# Patient Record
Sex: Male | Born: 1995 | Race: White | Hispanic: No | Smoking: Never smoker
Health system: Southern US, Community
[De-identification: ages and names within clinical notes are randomized; demographics above are authoritative.]

## PROBLEM LIST (undated history)

## (undated) DIAGNOSIS — J189 Pneumonia, unspecified organism: Secondary | ICD-10-CM

## (undated) HISTORY — PX: TONSILLECTOMY: SUR1361

---
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
## 2017-12-08 ENCOUNTER — Ambulatory Visit: Payer: Medicaid Other

## 2017-12-08 ENCOUNTER — Ambulatory Visit
Admission: EM | Admit: 2017-12-08 | Discharge: 2017-12-08 | Disposition: A | Discharge status: Home / Self Care | Payer: Medicaid Other | Attending: Family Medicine | Admitting: Family Medicine

## 2017-12-08 ENCOUNTER — Encounter: Payer: Self-pay | Admitting: Emergency Medicine

## 2017-12-08 DIAGNOSIS — R0602 Shortness of breath: Secondary | ICD-10-CM | POA: Insufficient documentation

## 2017-12-08 DIAGNOSIS — R062 Wheezing: Secondary | ICD-10-CM

## 2017-12-08 DIAGNOSIS — R05 Cough: Secondary | ICD-10-CM

## 2017-12-08 DIAGNOSIS — J181 Lobar pneumonia, unspecified organism: Secondary | ICD-10-CM

## 2017-12-08 DIAGNOSIS — J189 Pneumonia, unspecified organism: Secondary | ICD-10-CM | POA: Diagnosis not present

## 2017-12-08 DIAGNOSIS — R0989 Other specified symptoms and signs involving the circulatory and respiratory systems: Secondary | ICD-10-CM | POA: Diagnosis present

## 2017-12-08 DIAGNOSIS — Z79899 Other long term (current) drug therapy: Secondary | ICD-10-CM | POA: Diagnosis not present

## 2017-12-08 MED ORDER — GUAIFENESIN-CODEINE 100-10 MG/5ML PO SYRP: 5.0000 mL | ORAL_SOLUTION | Freq: Three times a day (TID) | ORAL | 0 refills | Status: DC | PRN

## 2017-12-08 MED ORDER — FLUTICASONE PROPIONATE 50 MCG/ACT NA SUSP
2.0000 | Freq: Every day | NASAL | 0 refills | Status: DC
Start: 2017-12-08 — End: 2019-07-08

## 2017-12-08 MED ORDER — AZITHROMYCIN 250 MG PO TABS: 250.0000 mg | ORAL_TABLET | Freq: Every day | ORAL | 0 refills | Status: DC

## 2017-12-08 MED ORDER — BENZONATATE 200 MG PO CAPS: ORAL_CAPSULE | ORAL | 0 refills | Status: DC

## 2017-12-08 MED ORDER — ALBUTEROL SULFATE HFA 108 (90 BASE) MCG/ACT IN AERS: 1.0000 | INHALATION_SPRAY | Freq: Four times a day (QID) | RESPIRATORY_TRACT | 0 refills | Status: DC | PRN

## 2017-12-08 MED ORDER — IPRATROPIUM-ALBUTEROL 0.5-2.5 (3) MG/3ML IN SOLN
3.0000 mL | Freq: Once | RESPIRATORY_TRACT | Status: AC
  Administered 2017-12-08: 3 mL via RESPIRATORY_TRACT

## 2017-12-08 NOTE — ED Triage Notes (Signed)
Pt reports cough, congestion, SOB that started about 2 weeks ago also reports left ear pain that started a couple days ago

## 2017-12-08 NOTE — Discharge Instructions (Signed)
Cough and deep breathe frequently.  Use albuterol inhaler for shortness of breath or wheezing.  Ibuprofen or Tylenol for fever or pain.  Go to emergency room if you worsen.  Recommend following up with your primary care physician in 4 to 6 weeks for possible follow-up x-ray

## 2017-12-08 NOTE — ED Provider Notes (Signed)
[facility]    CSN: [id number] Arrival date & time: 12/08/17  1541     History   Chief Complaint Chief Complaint  Patient presents with  . Cough    HPI Anthony Giles is a 22 y.o. male.   HPI  22 year old male presents with 2-week history of cough congestion shortness of breath chills starting about 2 weeks ago.  He also noticed a couple days ago pain in his left ear.  Wife, who just delivered a baby by C-section 8 days ago, has just been treated for a sinus infection. He has never  smoked before.  HIs cough is productive of thick yellow sputum.          History reviewed. No pertinent past medical history.  There are no active problems to display for this patient.   History reviewed. No pertinent surgical history.     Home Medications    Prior to Admission medications   Medication Sig Start Date End Date Taking? Authorizing Provider  Ascorbic Acid (VITAMIN C) 100 MG tablet Take 100 mg by mouth daily.   Yes [provider]  diphenhydrAMINE HCl (ALLERGY MED PO) Take by mouth.   Yes [provider]  albuterol (PROVENTIL HFA;VENTOLIN HFA) 108 (90 Base) MCG/ACT inhaler Inhale 1-2 puffs into the lungs every 6 (six) hours as needed for wheezing or shortness of breath. Use with spacer 12/08/17   Lutricia Feil, PA-C  azithromycin (ZITHROMAX) 250 MG tablet Take 1 tablet (250 mg total) by mouth daily. Take first 2 tablets together, then 1 every day until finished. 12/08/17   Lutricia Feil, PA-C  benzonatate (TESSALON) 200 MG capsule Take one cap TID PRN cough 12/08/17   Ovid Curd P, PA-C  fluticasone [facility]) 50 MCG/ACT nasal spray Place 2 sprays into both nostrils daily. 12/08/17   Lutricia Feil, PA-C  guaiFENesin-codeine (CHERATUSSIN AC) 100-10 MG/5ML syrup Take 5 mLs by mouth 3 (three) times daily as needed for cough. 12/08/17   Lutricia Feil, PA-C    Family History History reviewed. No pertinent family history.  Social  History Social History   Tobacco Use  . Smoking status: Never Smoker  . Smokeless tobacco: Never Used  Substance Use Topics  . Alcohol use: Never    Frequency: Never  . Drug use: Not on file     Allergies   Patient has no known allergies.   Review of Systems Review of Systems  Constitutional: Positive for activity change, chills and fatigue. Negative for appetite change and fever.  HENT: Positive for congestion, ear pain, postnasal drip, sinus pressure and sinus pain. Negative for ear discharge.   Respiratory: Positive for cough, shortness of breath and wheezing.   All other systems reviewed and are negative.    Physical Exam Triage Vital Signs ED Triage Vitals  Enc Vitals Group     BP 12/08/17 1555 (!) 133/96     Pulse Rate 12/08/17 1555 (!) 101     Resp 12/08/17 1555 16     Temp 12/08/17 1555 97.6 F (36.4 C)     Temp Source 12/08/17 1555 Oral     SpO2 12/08/17 1555 97 %     Weight --      Height 12/08/17 1556 6\' 3"  (1.905 m)     Head Circumference --      Peak Flow --      Pain Score 12/08/17 1555 5     Pain Loc --      Pain  Edu? --      Excl. in GC? --    No data found.  Updated Vital Signs BP 128/88 (BP Location: Left Arm)   Pulse (!) 101   Temp 97.6 F (36.4 C) (Oral)   Resp 16   Ht 6\' 3"  (1.905 m)   SpO2 97%   Visual Acuity Right Eye Distance:   Left Eye Distance:   Bilateral Distance:    Right Eye Near:   Left Eye Near:    Bilateral Near:     Physical Exam  Constitutional: He is oriented to person, place, and time. He appears well-developed and well-nourished. No distress.  HENT:  Head: Normocephalic.  Right Ear: External ear normal.  Left Ear: External ear normal.  Nose: Nose normal.  Mouth/Throat: Oropharynx is clear and moist. No oropharyngeal exudate.  Eyes: Pupils are equal, round, and reactive to light. Right eye exhibits no discharge. Left eye exhibits no discharge.  Neck: Normal range of motion.  Pulmonary/Chest: Effort  normal. He has wheezes. He has rales.  Musculoskeletal: Normal range of motion.  Lymphadenopathy:    He has no cervical adenopathy.  Neurological: He is alert and oriented to person, place, and time.  Skin: Skin is warm and dry. He is not diaphoretic.  Psychiatric: He has a normal mood and affect. His behavior is normal. Judgment and thought content normal.  Nursing note and vitals reviewed.    UC Treatments / Results  Labs (all labs ordered are listed, but only abnormal results are displayed) Labs Reviewed - No data to display  EKG None  Radiology Dg Chest 2 View  Result Date: 12/08/2017 CLINICAL DATA:  Cough, shortness of breath. EXAM: CHEST - 2 VIEW COMPARISON:  Radiographs of June 30, 2013. FINDINGS: The heart size and mediastinal contours are within normal limits. No pneumothorax or pleural effusion is noted. Left lung is clear. Mild right upper lobe opacity is noted which may represent pneumonia. The visualized skeletal structures are unremarkable. IMPRESSION: Possible mild right upper lobe opacity concerning for pneumonia. Electronically Signed   By: Lupita Raider, M.D.   On: 12/08/2017 16:39    Procedures Procedures (including critical care time)  Medications Ordered in UC Medications  ipratropium-albuterol (DUONEB) 0.5-2.5 (3) MG/3ML nebulizer solution 3 mL (3 mLs Nebulization Given 12/08/17 1624)    Initial Impression / Assessment and Plan / UC Course  I have reviewed the triage vital signs and the nursing notes.  Pertinent labs & imaging results that were available during my care of the patient were reviewed by me and considered in my medical decision making (see chart for details).     Plan: 1. Test/x-ray results and diagnosis reviewed with patient 2. rx as per orders; risks, benefits, potential side effects reviewed with patient 3. Recommend supportive treatment with deep breathe frequently.  Use albuterol inhaler for shortness of breath.  Use cough  suppressants as needed.  Tylenol or Motrin for fever or pain.  If you worsen go to the emergency room.  Otherwise follow-up with primary care in 4 to 6 weeks. 4. F/u prn if symptoms worsen or don't improve  Final Clinical Impressions(s) / UC Diagnoses   Final diagnoses:  Community acquired pneumonia of right upper lobe of lung (HCC)     Discharge Instructions     Cough and deep breathe frequently.  Use albuterol inhaler for shortness of breath or wheezing.  Ibuprofen or Tylenol for fever or pain.  Go to emergency room if you worsen.  Recommend  following up with your primary care physician in 4 to 6 weeks for possible follow-up x-ray    ED Prescriptions    Medication Sig Dispense Auth. Provider   azithromycin (ZITHROMAX) 250 MG tablet Take 1 tablet (250 mg total) by mouth daily. Take first 2 tablets together, then 1 every day until finished. 6 tablet Ovid Curdoemer, William P, PA-C   albuterol (PROVENTIL HFA;VENTOLIN HFA) 108 (90 Base) MCG/ACT inhaler Inhale 1-2 puffs into the lungs every 6 (six) hours as needed for wheezing or shortness of breath. Use with spacer 1 Inhaler Lutricia Feiloemer, William P, PA-C   benzonatate (TESSALON) 200 MG capsule Take one cap TID PRN cough 30 capsule Ovid Curdoemer, William P, PA-C   guaiFENesin-codeine (CHERATUSSIN AC) 100-10 MG/5ML syrup Take 5 mLs by mouth 3 (three) times daily as needed for cough. 120 mL Ovid Curdoemer, William P, PA-C   fluticasone (FLONASE) 50 MCG/ACT nasal spray Place 2 sprays into both nostrils daily. 16 g Lutricia Feiloemer, William P, PA-C     Controlled Substance Prescriptions [place] Controlled Substance Registry consulted? Not Applicable   Lutricia FeilRoemer, William P, PA-C 12/08/17 1707

## 2018-03-13 ENCOUNTER — Encounter: Payer: Self-pay | Admitting: Emergency Medicine

## 2018-03-13 ENCOUNTER — Other Ambulatory Visit: Payer: Self-pay

## 2018-03-13 ENCOUNTER — Ambulatory Visit
Admission: EM | Admit: 2018-03-13 | Discharge: 2018-03-13 | Disposition: A | Discharge status: Home / Self Care | Payer: Medicaid Other | Attending: Family Medicine | Admitting: Family Medicine

## 2018-03-13 DIAGNOSIS — B9789 Other viral agents as the cause of diseases classified elsewhere: Secondary | ICD-10-CM | POA: Diagnosis not present

## 2018-03-13 DIAGNOSIS — Z79899 Other long term (current) drug therapy: Secondary | ICD-10-CM | POA: Diagnosis not present

## 2018-03-13 DIAGNOSIS — J069 Acute upper respiratory infection, unspecified: Secondary | ICD-10-CM

## 2018-03-13 DIAGNOSIS — J029 Acute pharyngitis, unspecified: Secondary | ICD-10-CM | POA: Diagnosis present

## 2018-03-13 DIAGNOSIS — J028 Acute pharyngitis due to other specified organisms: Secondary | ICD-10-CM | POA: Diagnosis not present

## 2018-03-13 HISTORY — DX: Pneumonia, unspecified organism: J18.9

## 2018-03-13 LAB — RAPID STREP SCREEN (MED CTR MEBANE ONLY): Streptococcus, Group A Screen (Direct): NEGATIVE

## 2018-03-13 MED ORDER — LIDOCAINE VISCOUS HCL 2 % MT SOLN: OROMUCOSAL | 0 refills | Status: DC

## 2018-03-13 NOTE — ED Provider Notes (Signed)
[facility]    CSN: [id number] Arrival date & time: 03/13/18  1339     History   Chief Complaint Chief Complaint  Patient presents with  . Sore Throat    HPI Anthony Giles is a 22 y.o. male.   The history is provided by the patient.  Sore Throat  This is a new problem. The current episode started 2 days ago. The problem occurs constantly. The problem has not changed since onset.Pertinent negatives include no chest pain, no abdominal pain, no headaches and no shortness of breath. He has tried nothing for the symptoms.    Past Medical History:  Diagnosis Date  . Pneumonia     There are no active problems to display for this patient.   History reviewed. No pertinent surgical history.     Home Medications    Prior to Admission medications   Medication Sig Start Date End Date Taking? Authorizing Provider  Ascorbic Acid (VITAMIN C) 100 MG tablet Take 100 mg by mouth daily.   Yes [provider]  albuterol (PROVENTIL HFA;VENTOLIN HFA) 108 (90 Base) MCG/ACT inhaler Inhale 1-2 puffs into the lungs every 6 (six) hours as needed for wheezing or shortness of breath. Use with spacer 12/08/17   Lutricia Feil, PA-C  azithromycin (ZITHROMAX) 250 MG tablet Take 1 tablet (250 mg total) by mouth daily. Take first 2 tablets together, then 1 every day until finished. 12/08/17   Lutricia Feil, PA-C  benzonatate (TESSALON) 200 MG capsule Take one cap TID PRN cough 12/08/17   Ovid Curd P, PA-C  diphenhydrAMINE HCl (ALLERGY MED PO) Take by mouth.    [provider]  fluticasone (FLONASE) 50 MCG/ACT nasal spray Place 2 sprays into both nostrils daily. 12/08/17   Lutricia Feil, PA-C  guaiFENesin-codeine (CHERATUSSIN AC) 100-10 MG/5ML syrup Take 5 mLs by mouth 3 (three) times daily as needed for cough. 12/08/17   Lutricia Feil, PA-C  lidocaine (XYLOCAINE) 2 % solution 20 ml gargle and spit q 6 hours prn 03/13/18   Payton Mccallum, MD    Family  History Family History  Problem Relation Age of Onset  . Healthy Mother   . CAD Father     Social History Social History   Tobacco Use  . Smoking status: Never Smoker  . Smokeless tobacco: Never Used  Substance Use Topics  . Alcohol use: Never    Frequency: Never  . Drug use: Never     Allergies   Patient has no known allergies.   Review of Systems Review of Systems  Respiratory: Negative for shortness of breath.   Cardiovascular: Negative for chest pain.  Gastrointestinal: Negative for abdominal pain.  Neurological: Negative for headaches.     Physical Exam Triage Vital Signs ED Triage Vitals  Enc Vitals Group     BP 03/13/18 1408 131/79     Pulse Rate 03/13/18 1408 91     Resp 03/13/18 1408 18     Temp 03/13/18 1408 97.8 F (36.6 C)     Temp Source 03/13/18 1408 Oral     SpO2 03/13/18 1408 100 %     Weight 03/13/18 1405 (!) 368 lb (166.9 kg)     Height 03/13/18 1405 6\' 3"  (1.905 m)     Head Circumference --      Peak Flow --      Pain Score 03/13/18 1405 6     Pain Loc --      Pain Edu? --  Excl. in GC? --    No data found.  Updated Vital Signs BP 131/79 (BP Location: Right Arm)   Pulse 91   Temp 97.8 F (36.6 C) (Oral)   Resp 18   Ht 6\' 3"  (1.905 m)   Wt (!) 166.9 kg   SpO2 100%   BMI 46.00 kg/m   Visual Acuity Right Eye Distance:   Left Eye Distance:   Bilateral Distance:    Right Eye Near:   Left Eye Near:    Bilateral Near:     Physical Exam  Constitutional: He appears well-developed and well-nourished. No distress.  HENT:  Head: Normocephalic and atraumatic.  Right Ear: Tympanic membrane, external ear and ear canal normal.  Left Ear: Tympanic membrane, external ear and ear canal normal.  Nose: Nose normal.  Mouth/Throat: Uvula is midline and mucous membranes are normal. Posterior oropharyngeal erythema present. No oropharyngeal exudate, posterior oropharyngeal edema or tonsillar abscesses. No tonsillar exudate.  Neck:  Normal range of motion. Neck supple. No tracheal deviation present. No thyromegaly present.  Cardiovascular: Normal rate, regular rhythm and normal heart sounds.  Pulmonary/Chest: Effort normal and breath sounds normal. No stridor. No respiratory distress. He has no wheezes. He has no rales. He exhibits no tenderness.  Lymphadenopathy:    He has no cervical adenopathy.  Neurological: He is alert.  Skin: Skin is warm and dry. No rash noted. He is not diaphoretic.  Nursing note and vitals reviewed.    UC Treatments / Results  Labs (all labs ordered are listed, but only abnormal results are displayed) Labs Reviewed  RAPID STREP SCREEN ([facility] ONLY)  CULTURE, GROUP A STREP [facility])    EKG None  Radiology No results found.  Procedures Procedures (including critical care time)  Medications Ordered in UC Medications - No data to display  Initial Impression / Assessment and Plan / UC Course  I have reviewed the triage vital signs and the nursing notes.  Pertinent labs & imaging results that were available during my care of the patient were reviewed by me and considered in my medical decision making (see chart for details).      Final Clinical Impressions(s) / UC Diagnoses   Final diagnoses:  Viral pharyngitis  Viral URI    ED Prescriptions    Medication Sig Dispense Auth. Provider   lidocaine (XYLOCAINE) 2 % solution 20 ml gargle and spit q 6 hours prn 100 mL Payton Mccallumonty, Velisa Regnier, MD      1. Lab result and diagnosis reviewed with patient 2. rx as per orders above; reviewed possible side effects, interactions, risks and benefits  3. Follow-up prn if symptoms worsen or don't improve   Controlled Substance Prescriptions [place] Controlled Substance Registry consulted? Not Applicable   Payton Mccallumonty, Oneita Allmon, MD 03/13/18 [phone]

## 2018-03-13 NOTE — ED Triage Notes (Signed)
Patient c/o sore throat and nasal drainage and congestion that started 2 days ago.

## 2018-03-16 LAB — CULTURE, GROUP A STREP (THRC)

## 2018-07-31 ENCOUNTER — Other Ambulatory Visit: Payer: Self-pay

## 2018-07-31 ENCOUNTER — Ambulatory Visit: Payer: Medicaid Other

## 2018-07-31 ENCOUNTER — Ambulatory Visit
Admission: EM | Admit: 2018-07-31 | Discharge: 2018-07-31 | Disposition: A | Discharge status: Home / Self Care | Payer: Medicaid Other | Attending: Family Medicine | Admitting: Family Medicine

## 2018-07-31 DIAGNOSIS — J029 Acute pharyngitis, unspecified: Secondary | ICD-10-CM | POA: Diagnosis not present

## 2018-07-31 DIAGNOSIS — J069 Acute upper respiratory infection, unspecified: Secondary | ICD-10-CM

## 2018-07-31 DIAGNOSIS — Z7189 Other specified counseling: Secondary | ICD-10-CM | POA: Diagnosis present

## 2018-07-31 LAB — RAPID STREP SCREEN (MED CTR MEBANE ONLY): Streptococcus, Group A Screen (Direct): NEGATIVE

## 2018-07-31 MED ORDER — LIDOCAINE VISCOUS HCL 2 % MT SOLN: 10.0000 mL | Freq: Four times a day (QID) | OROMUCOSAL | 0 refills | Status: DC | PRN

## 2018-07-31 NOTE — Discharge Instructions (Addendum)
Take medication as prescribed. Rest. Drink plenty of fluids. Refer to [facility] information and adhere. Stay home. Over the counter medication as discussed.   Follow up with your primary care physician this week as needed. Return to Urgent care for new or worsening concerns.

## 2018-07-31 NOTE — ED Provider Notes (Signed)
[facility] ____________________________________________  Time seen: Approximately 9:41 AM  I have reviewed the triage vital signs and the nursing notes.   HISTORY  Chief Complaint Sore Throat  HPI Anthony Giles is a 23 y.o. male presenting for evaluation of 1 week of cough, nasal congestion and sore throat.  States the cough has been very mild and only occasional.  States has continued with clear runny nose and sore throat.  States sore throat bothers him the most, and is painful to swallow currently moderate.  States has continued to eat and drink well overall though.  Also states he has had some intermittent gooping from his right eye only at night.  Denies vision change or eye pain.  Denies fevers.  Denies accompanying chest pain, shortness of breath.  States his wife and his mother were recently sick with cough complaints and they were evaluated and tested negative for COVID-19.  Denies other known sick contacts.  Has continued remain active.  No recent travel.  Has been using over-the-counter Alka-Seltzer cold with some improvement.  Denies difficulty sleeping.  Denies other aggravating alleviating factors.  No recent sickness.    Past Medical History:  Diagnosis Date  . Pneumonia     There are no active problems to display for this patient.   Past Surgical History:  Procedure Laterality Date  . TONSILLECTOMY       No current facility-administered medications for this encounter.   Current Outpatient Medications:  .  Ascorbic Acid (VITAMIN C) 100 MG tablet, Take 100 mg by mouth daily., Disp: , Rfl:  .  fluticasone (FLONASE) 50 MCG/ACT nasal spray, Place 2 sprays into both nostrils daily., Disp: 16 g, Rfl: 0 .  lidocaine (XYLOCAINE) 2 % solution, Use as directed 10 mLs in the mouth or throat every 6 (six) hours as needed (sore throat. gargle and spit as needed for sore throat.)., Disp: 100 mL, Rfl: 0  Allergies Patient has no known allergies.  Family History   Problem Relation Age of Onset  . Healthy Mother   . CAD Father     Social History Social History   Tobacco Use  . Smoking status: Never Smoker  . Smokeless tobacco: Never Used  Substance Use Topics  . Alcohol use: Never    Frequency: Never  . Drug use: Never    Review of Systems Constitutional: No fever/chills Eyes: No visual changes. As above.  ENT: As above.  Cardiovascular: Denies chest pain. Respiratory: Denies shortness of breath. Gastrointestinal: No abdominal pain.   Musculoskeletal: Negative for back pain. Skin: Negative for rash.   ____________________________________________   PHYSICAL EXAM:  VITAL SIGNS: ED Triage Vitals  Enc Vitals Group     BP 07/31/18 0824 (!) 132/93     Pulse Rate 07/31/18 0824 98     Resp 07/31/18 0824 16     Temp 07/31/18 0824 98.4 F (36.9 C)     Temp Source 07/31/18 0824 Oral     SpO2 07/31/18 0824 100 %     Weight 07/31/18 0819 (!) 382 lb (173.3 kg)     Height 07/31/18 0819 6\' 3"  (1.905 m)     Head Circumference --      Peak Flow --      Pain Score 07/31/18 0818 8     Pain Loc --      Pain Edu? --      Excl. in GC? --     Constitutional: Alert and oriented. Well appearing and in no acute  distress. Eyes: Conjunctivae are normal.  No active drainage bilaterally.  No foreign body noted bilaterally.  PERRL. EOMI. Head: Atraumatic. No sinus tenderness to palpation. No swelling. No erythema.  Ears: no erythema, normal TMs bilaterally.   Nose:Nasal congestion with clear rhinorrhea  Mouth/Throat: Mucous membranes are moist. Mild pharyngeal erythema.  Tonsils surgically absent.  No exudate.  No uvular shift or deviation. Neck: No stridor.  No cervical spine tenderness to palpation. Hematological/Lymphatic/Immunilogical: No cervical lymphadenopathy. Cardiovascular: Normal rate, regular rhythm. Grossly normal heart sounds.  Good peripheral circulation. Respiratory: Normal respiratory effort.  No retractions. No wheezes, rales or  rhonchi. Good air movement.  Musculoskeletal: Ambulatory with steady gait.  Neurologic:  Normal speech and language. No gait instability. Skin:  Skin appears warm, dry and intact. No rash noted. Psychiatric: Mood and affect are normal. Speech and behavior are normal.  ___________________________________________   LABS (all labs ordered are listed, but only abnormal results are displayed)  Labs Reviewed  RAPID STREP SCREEN ([facility] ONLY)  CULTURE, GROUP A STREP [facility])    RADIOLOGY  Dg Chest 2 View  Result Date: 07/31/2018 CLINICAL DATA:  Cough for 1 week EXAM: CHEST - 2 VIEW COMPARISON:  12/08/2017 chest radiograph. FINDINGS: Stable cardiomediastinal silhouette with normal heart size. No pneumothorax. No pleural effusion. Lungs appear clear, with no acute consolidative airspace disease and no pulmonary edema. IMPRESSION: No active cardiopulmonary disease. Electronically Signed   By: Delbert Phenix M.D.   On: 07/31/2018 08:54   ____________________________________________   PROCEDURES Procedures    INITIAL IMPRESSION / ASSESSMENT AND PLAN / ED COURSE  Pertinent labs & imaging results that were available during my care of the patient were reviewed by me and considered in my medical decision making (see chart for details).  Well-appearing patient.  No acute distress.  Suspect viral illness.  Strep negative will culture.  Chest x-ray as above, no active cardiopulmonary disease.  Will treat with PRN viscous lidocaine.  Continue over-the-counter supportive care.  Rest, fluids and monitor.  [place] DHHS COVID-19 information also given directed to adhere and remain home.Discussed indication, risks and benefits of medications with patient.  Discussed follow up with Primary care physician this week. Discussed follow up and return parameters including no resolution or any worsening concerns. Patient verbalized understanding and agreed to plan.   ____________________________________________    FINAL CLINICAL IMPRESSION(S) / ED DIAGNOSES  Final diagnoses:  Pharyngitis, unspecified etiology  Upper respiratory tract infection, unspecified type  Advice Given About Covid-19 Virus Infection     ED Discharge Orders         Ordered    lidocaine (XYLOCAINE) 2 % solution  Every 6 hours PRN     07/31/18 5797           Note: This dictation was prepared with Dragon dictation along with smaller phrase technology. Any transcriptional errors that result from this process are unintentional.         Renford Dills, NP 07/31/18 [phone]

## 2018-07-31 NOTE — ED Triage Notes (Signed)
Patient complains of sore throat and cough that started 1 week ago, denies any fever or shortness of breath.

## 2018-08-03 LAB — CULTURE, GROUP A STREP (THRC)

## 2018-09-24 IMAGING — CR DG CHEST 2V
3 series · 3 of 3 positions shown · non-contrast
Comparison: Radiographs June 30, 2013.

CLINICAL DATA: Cough, shortness of breath.

EXAM:
CHEST - 2 VIEW

[chest pa]
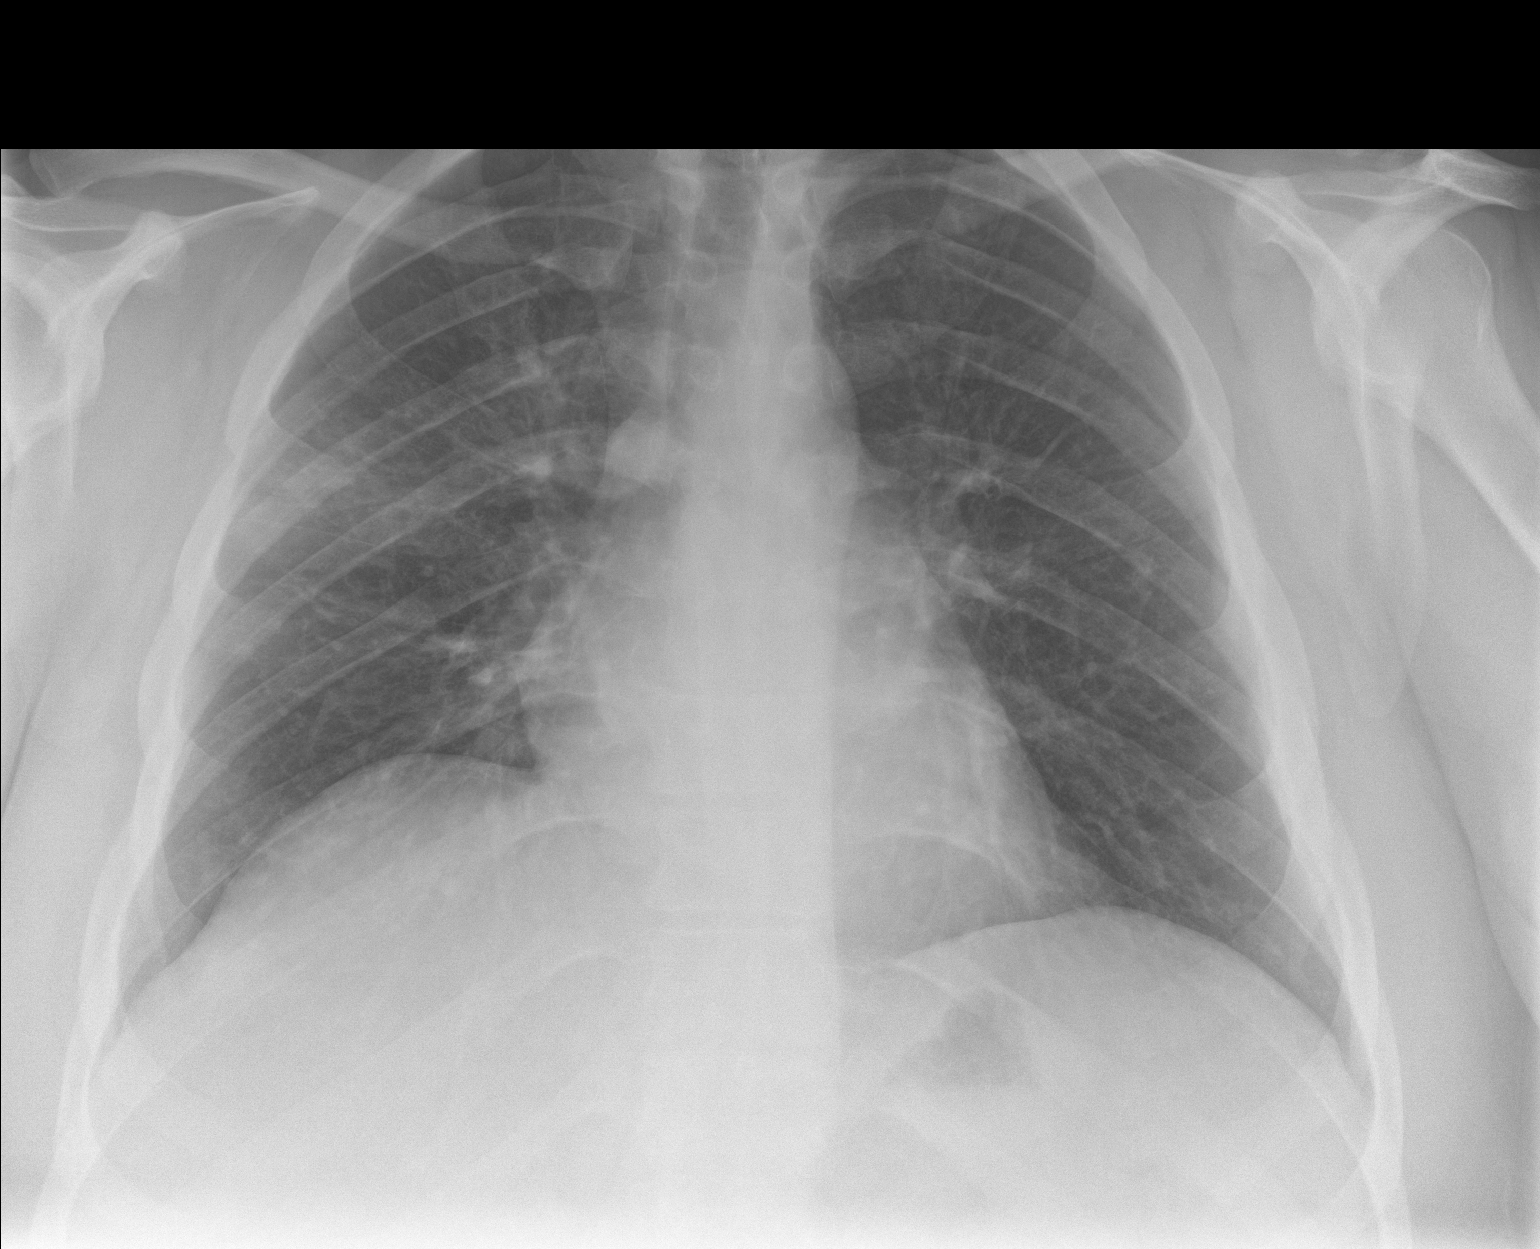

[chest lat (1 of 2)]
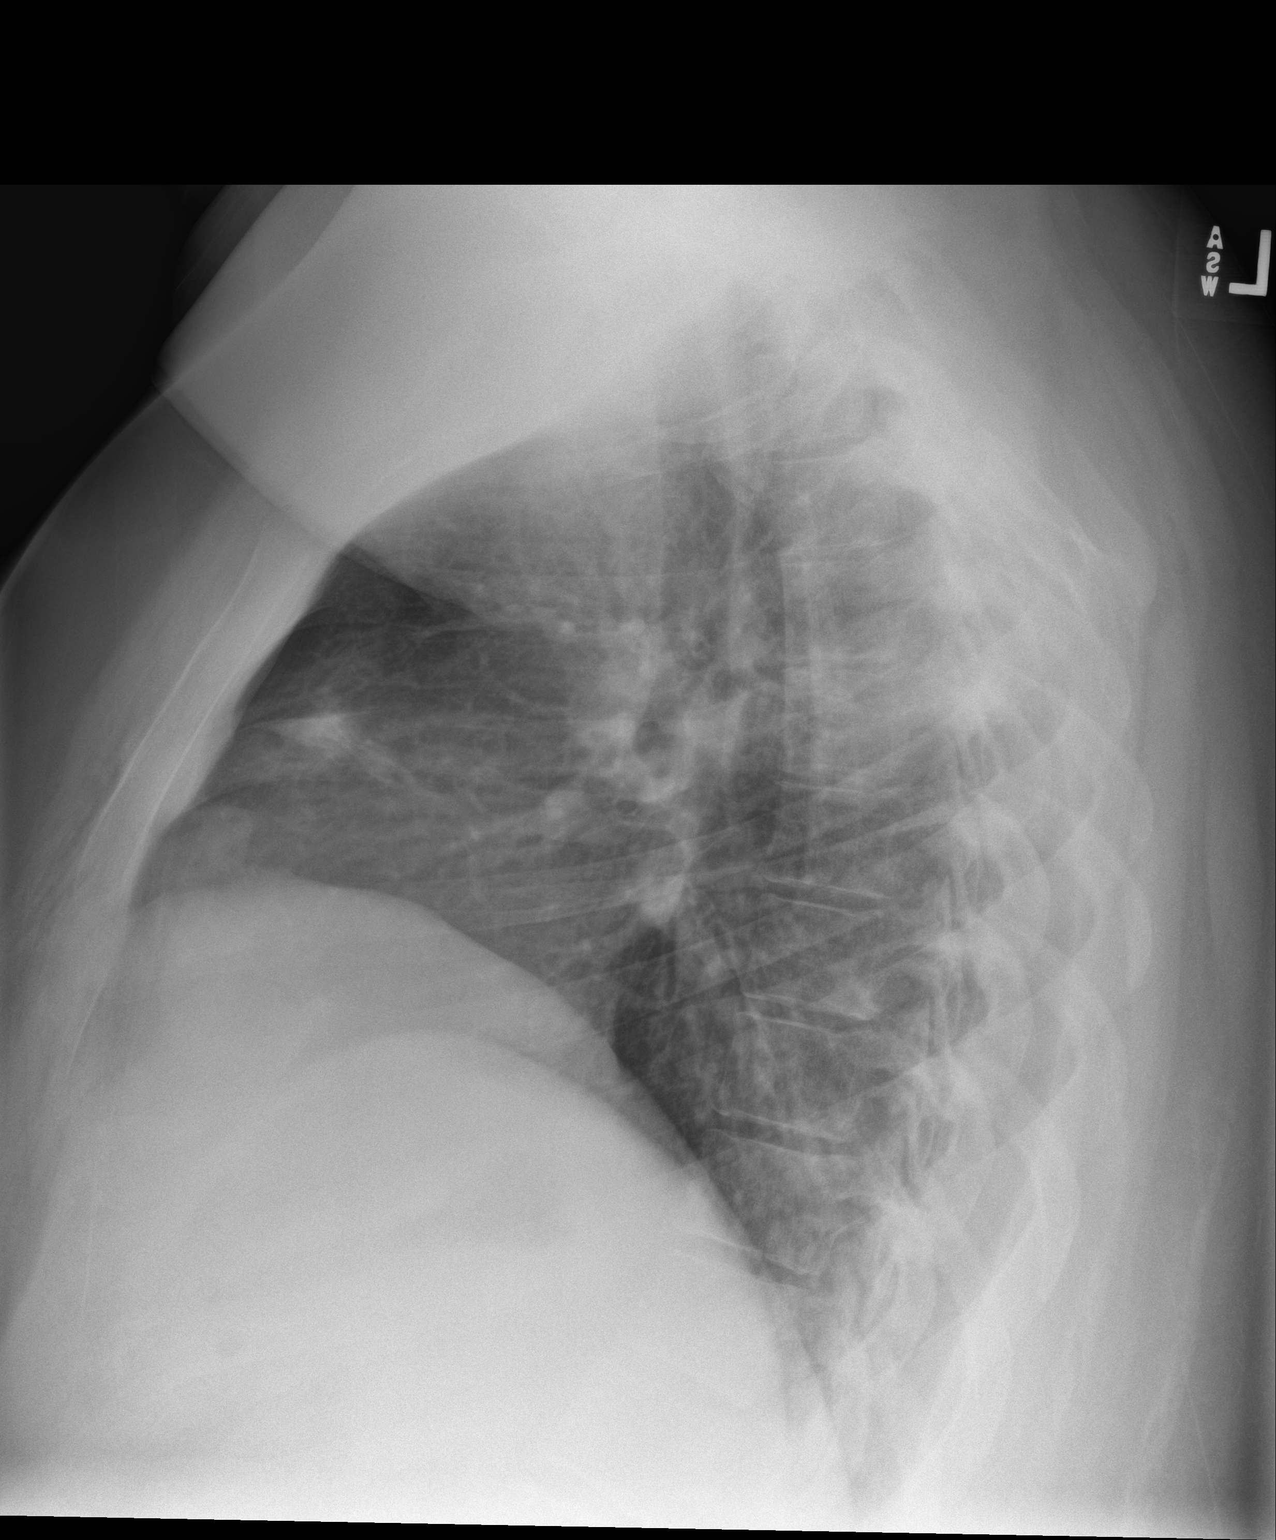

[chest lat (2 of 2)]
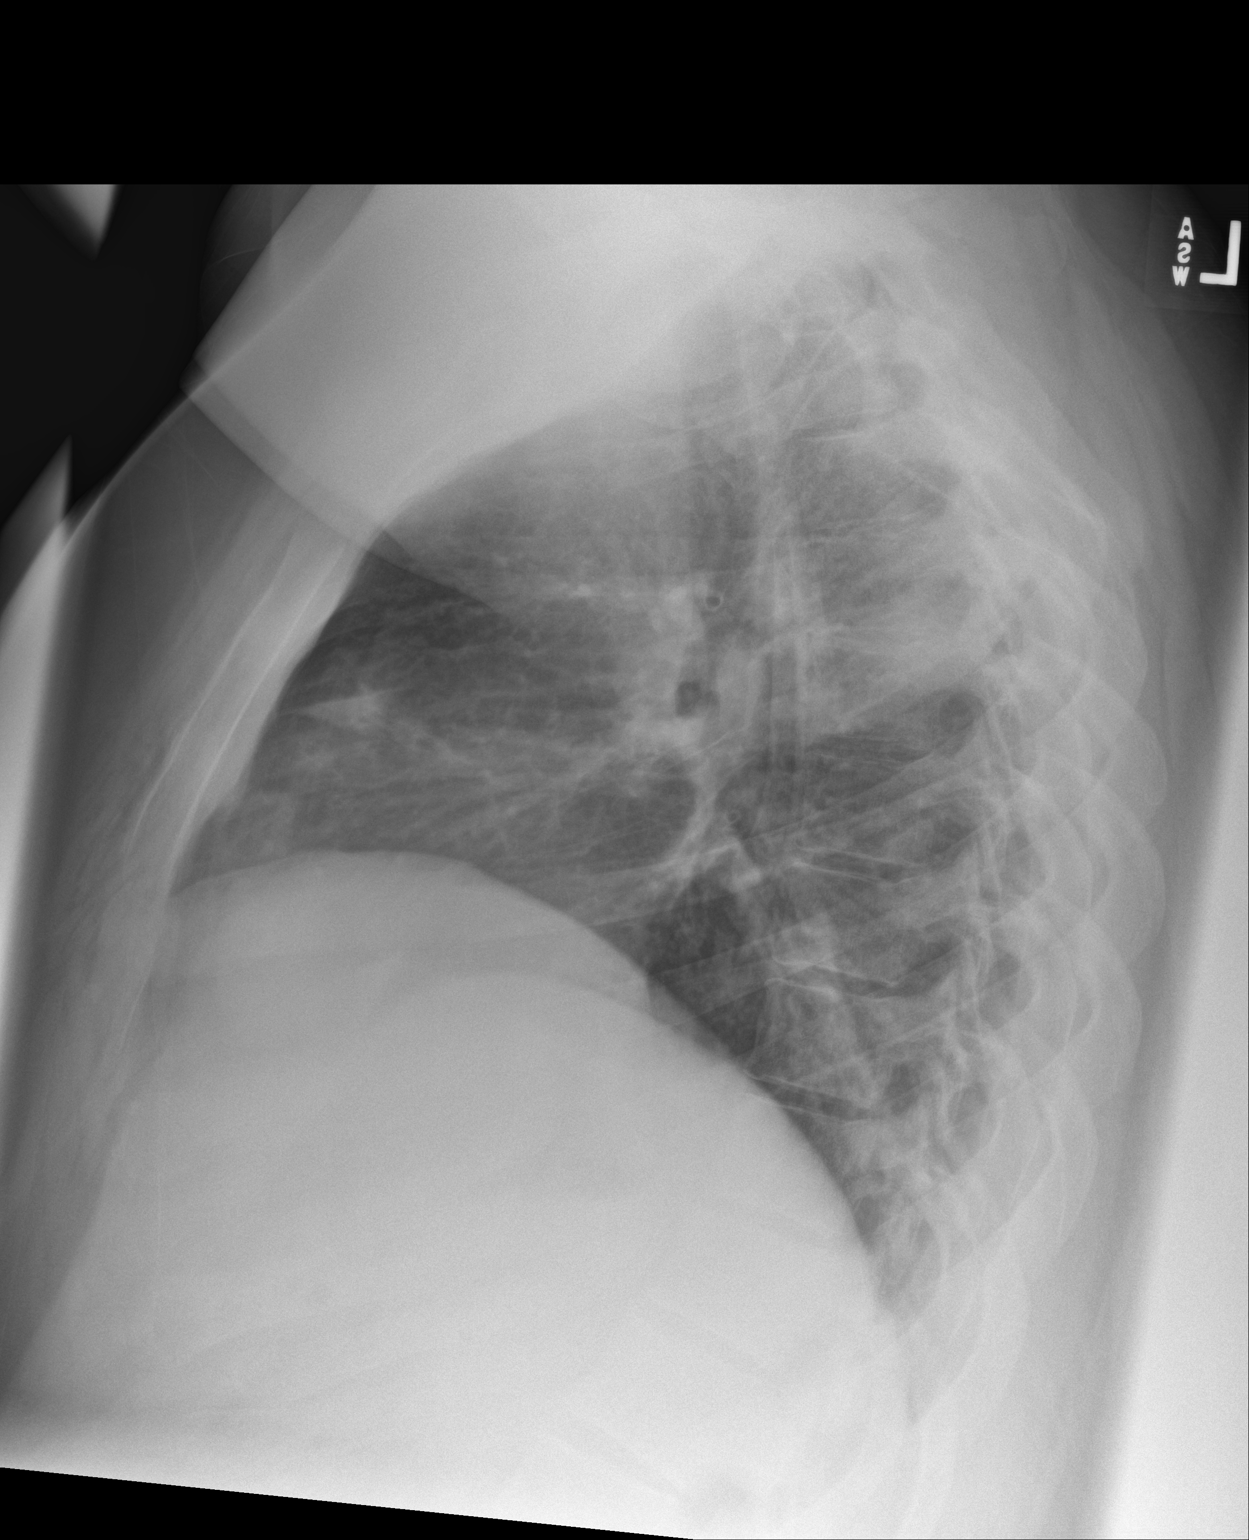

[3 of 3 positions shown; findings below may reference images not displayed]

FINDINGS: The heart size and mediastinal contours are within normal limits. No
pneumothorax or pleural effusion is noted. Left lung is clear. Mild
right upper lobe opacity is noted which may represent pneumonia. The
visualized skeletal structures are unremarkable.
IMPRESSION: Possible mild right upper lobe opacity concerning for pneumonia.

## 2019-07-08 ENCOUNTER — Ambulatory Visit: Payer: Medicaid Other

## 2019-07-08 ENCOUNTER — Ambulatory Visit
Admission: EM | Admit: 2019-07-08 | Discharge: 2019-07-08 | Disposition: A | Discharge status: Home / Self Care | Payer: Medicaid Other | Attending: Family Medicine | Admitting: Family Medicine

## 2019-07-08 ENCOUNTER — Other Ambulatory Visit: Payer: Self-pay

## 2019-07-08 DIAGNOSIS — S82832A Other fracture of upper and lower end of left fibula, initial encounter for closed fracture: Secondary | ICD-10-CM | POA: Insufficient documentation

## 2019-07-08 DIAGNOSIS — W010XXA Fall on same level from slipping, tripping and stumbling without subsequent striking against object, initial encounter: Secondary | ICD-10-CM

## 2019-07-08 MED ORDER — KETOROLAC TROMETHAMINE 10 MG PO TABS: 10.0000 mg | ORAL_TABLET | Freq: Four times a day (QID) | ORAL | 0 refills | Status: AC | PRN

## 2019-07-08 NOTE — ED Provider Notes (Signed)
[facility]    CSN: [id number] Arrival date & time: 07/08/19  1318      History   Chief Complaint Chief Complaint  Patient presents with  . Ankle Pain    left   HPI  24 year old male presents with the above complaint.  Patient reports that he was stepping off the porch onto a stepping stone and rolled his left ankle.  He reports significant swelling and pain particular around the lateral malleolus.  Pain 8/10 in severity.  Worse with palpation and activity.  No relieving factors.  No other associated symptoms.  No other complaints.  Past Medical History:  Diagnosis Date  . Pneumonia    Past Surgical History:  Procedure Laterality Date  . TONSILLECTOMY     Home Medications    Prior to Admission medications   Medication Sig Start Date End Date Taking? Authorizing Provider  Ascorbic Acid (VITAMIN C) 100 MG tablet Take 100 mg by mouth daily.   Yes [provider]  cetirizine (ZYRTEC) 10 MG tablet Take by mouth.   Yes [provider]  ketorolac (TORADOL) 10 MG tablet Take 1 tablet (10 mg total) by mouth every 6 (six) hours as needed for moderate pain or severe pain. 07/08/19   Tommie Sams, DO  fluticasone (FLONASE) 50 MCG/ACT nasal spray Place 2 sprays into both nostrils daily. 12/08/17 07/08/19  Lutricia Feil, PA-C    Family History Family History  Problem Relation Age of Onset  . Healthy Mother   . CAD Father     Social History Social History   Tobacco Use  . Smoking status: Never Smoker  . Smokeless tobacco: Never Used  Substance Use Topics  . Alcohol use: Never  . Drug use: Never     Allergies   Patient has no known allergies.   Review of Systems Review of Systems  Constitutional: Negative.   Musculoskeletal:       Left ankle pain and swelling.   Physical Exam Triage Vital Signs ED Triage Vitals  Enc Vitals Group     BP 07/08/19 1337 104/67     Pulse Rate 07/08/19 1337 71     Resp 07/08/19 1337 18     Temp  07/08/19 1337 98 F (36.7 C)     Temp Source 07/08/19 1337 Oral     SpO2 07/08/19 1337 100 %     Weight --      Height 07/08/19 1335 6\' 3"  (1.905 m)     Head Circumference --      Peak Flow --      Pain Score 07/08/19 1334 8     Pain Loc --      Pain Edu? --      Excl. in GC? --    No data found.  Updated Vital Signs BP 104/67 (BP Location: Right Arm)   Pulse 71   Temp 98 F (36.7 C) (Oral)   Resp 18   Ht 6\' 3"  (1.905 m)   SpO2 100%   BMI 47.75 kg/m   Visual Acuity Right Eye Distance:   Left Eye Distance:   Bilateral Distance:    Right Eye Near:   Left Eye Near:    Bilateral Near:     Physical Exam Vitals and nursing note reviewed.  Constitutional:      General: He is not in acute distress.    Appearance: He is obese.  HENT:     Head: Normocephalic and atraumatic.  Eyes:  General:        Right eye: No discharge.        Left eye: No discharge.     Conjunctiva/sclera: Conjunctivae normal.  Pulmonary:     Effort: Pulmonary effort is normal. No respiratory distress.  Musculoskeletal:     Comments: Left ankle -swelling noted over the lateral malleolus.  Exquisitely tender to palpation.  Neurological:     Mental Status: He is alert.  Psychiatric:        Mood and Affect: Mood normal.        Behavior: Behavior normal.    UC Treatments / Results  Labs (all labs ordered are listed, but only abnormal results are displayed) Labs Reviewed - No data to display  EKG   Radiology DG Ankle Complete Left  Result Date: 07/08/2019 CLINICAL DATA:  Left ankle injury. EXAM: LEFT ANKLE COMPLETE - 3+ VIEW COMPARISON:  None. FINDINGS: There is question subtle cortical discontinuity in the distal tip of the fibula raising the question of fracture. Soft tissue swelling is identified in the lateral left ankle. There is no dislocation. IMPRESSION: There is question subtle cortical discontinuity in the distal tip of the fibula raising the question of fracture. Soft tissue  swelling is identified in the lateral left ankle. Electronically Signed   By: Abelardo Diesel M.D.   On: 07/08/2019 14:28    Procedures Procedures (including critical care time)  Medications Ordered in UC Medications - No data to display  Initial Impression / Assessment and Plan / UC Course  I have reviewed the triage vital signs and the nursing notes.  Pertinent labs & imaging results that were available during my care of the patient were reviewed by me and considered in my medical decision making (see chart for details).    24 year old male presents with a fracture of the distal tip of the left fibula.  Placed in a cam walker.  Toradol as needed.  Advised rest, ice, elevation.  Follow-up with Ortho.  Final Clinical Impressions(s) / UC Diagnoses   Final diagnoses:  Other closed fracture of distal end of left fibula, initial encounter     Discharge Instructions     Rest, ice, elevation.  Medication as prescribed.  Please call [facility] [phone]) OR [facility] [phone]) for an appt.  Take care  Dr. Lacinda Axon     ED Prescriptions    Medication Sig Dispense Auth. Provider   ketorolac (TORADOL) 10 MG tablet Take 1 tablet (10 mg total) by mouth every 6 (six) hours as needed for moderate pain or severe pain. 20 tablet Coral Spikes, DO     PDMP not reviewed this encounter.   Yuniel, [place], [place] 07/08/19 1625

## 2019-07-08 NOTE — ED Triage Notes (Signed)
Patient complains of left foot/ankle pain x 1.5 hours ago. States that he stepped off a stepping stone wrong and rolled the foot, now with swelling and painful ambulation.

## 2019-07-08 NOTE — Discharge Instructions (Signed)
Rest, ice, elevation.  Medication as prescribed.  Please call [facility] ([phone]) OR [facility] ([phone]) for an appt.  Take care  Dr. Episcopo  

## 2020-04-23 IMAGING — CR DG ANKLE COMPLETE 3+V*L*
3 series · 3 of 3 positions shown · non-contrast
Comparison: None.

CLINICAL DATA: Left ankle injury.

EXAM:
LEFT ANKLE COMPLETE - 3+ VIEW

[ankle ap]
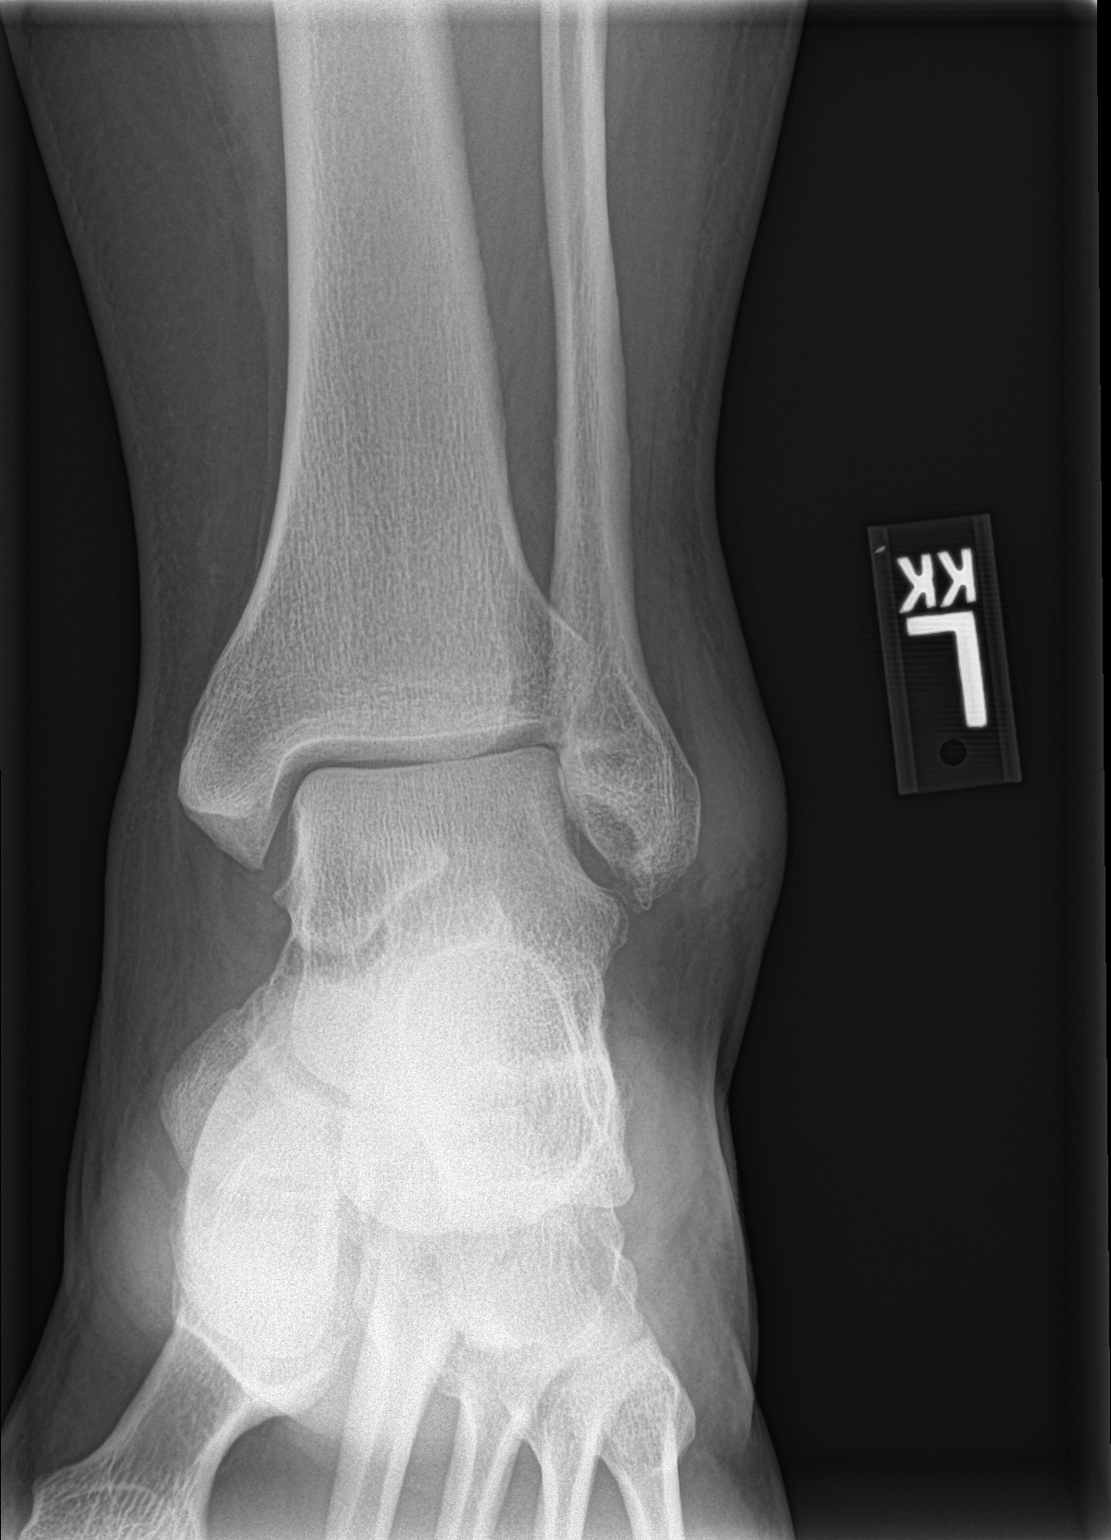

[ankle obl]
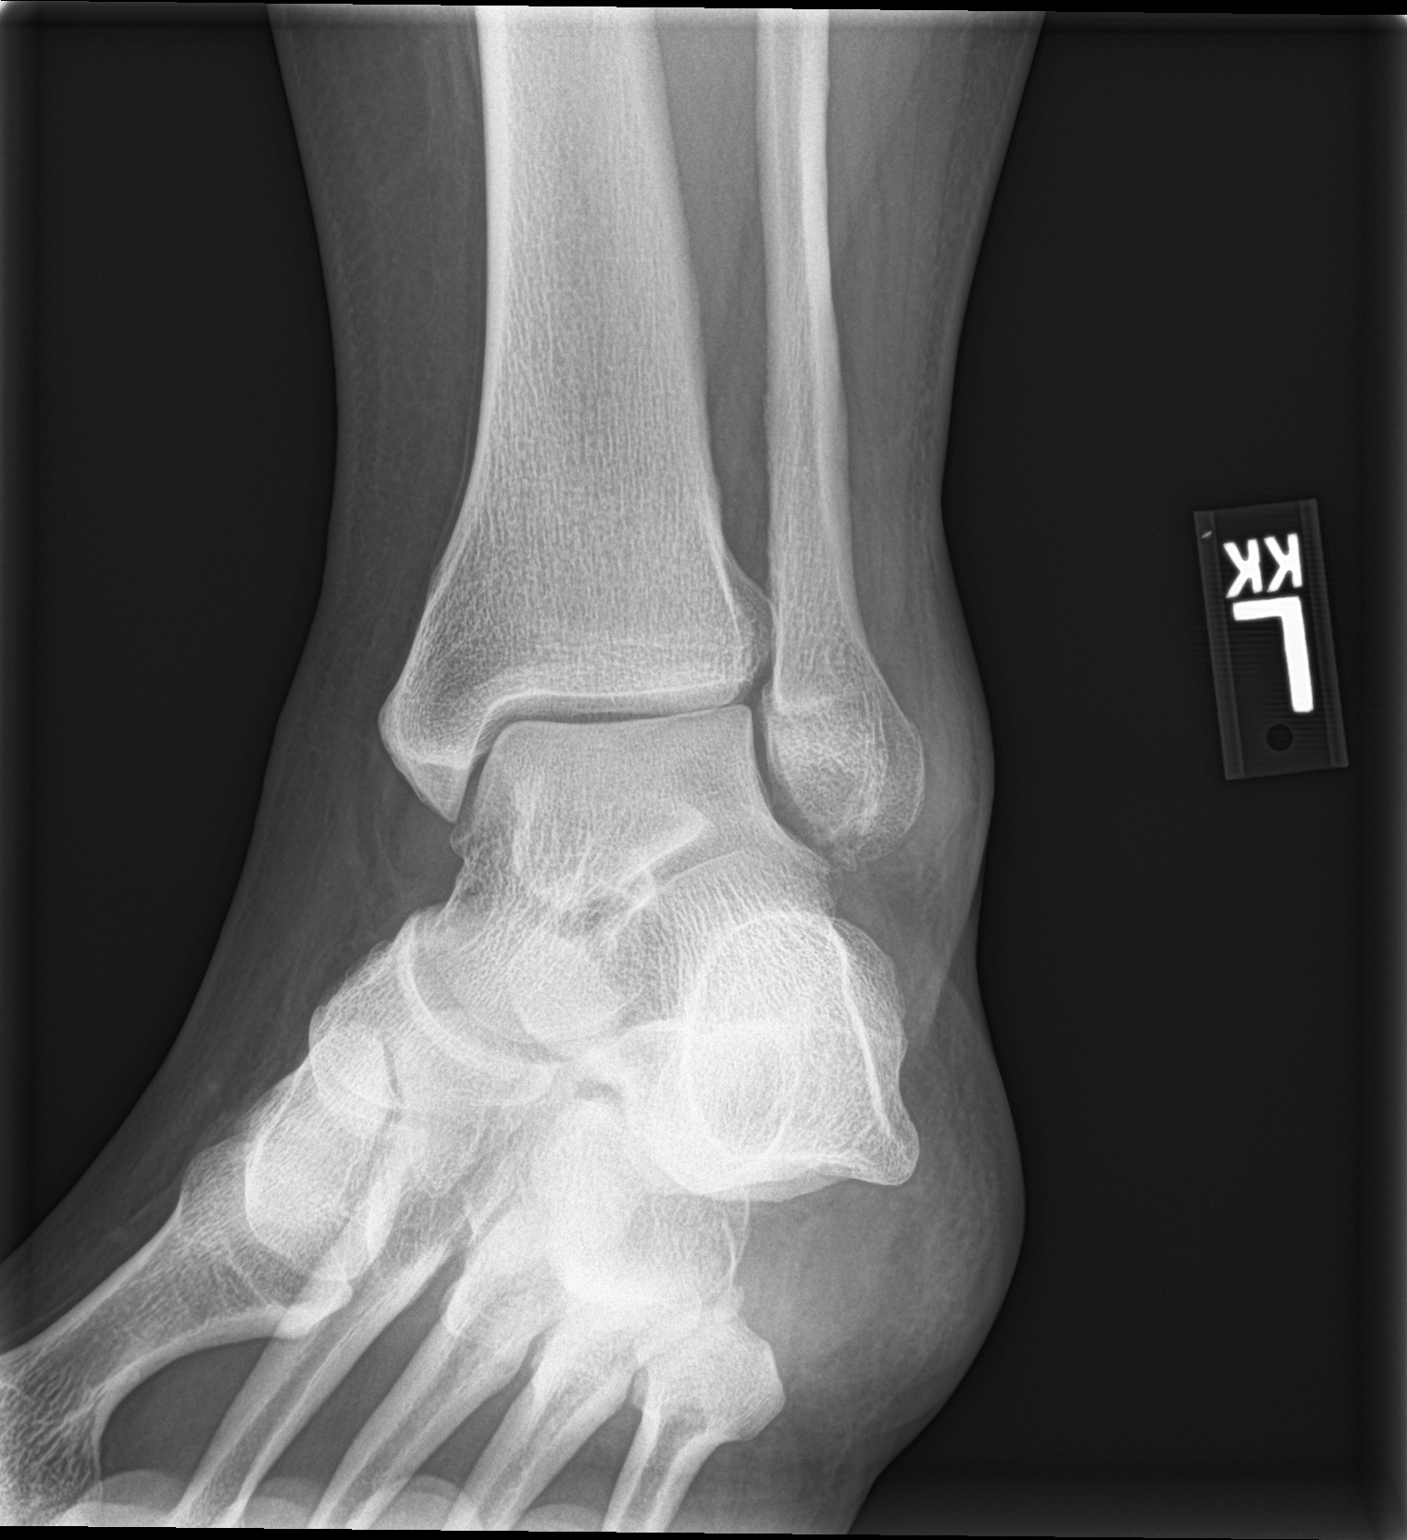

[ankle lat]
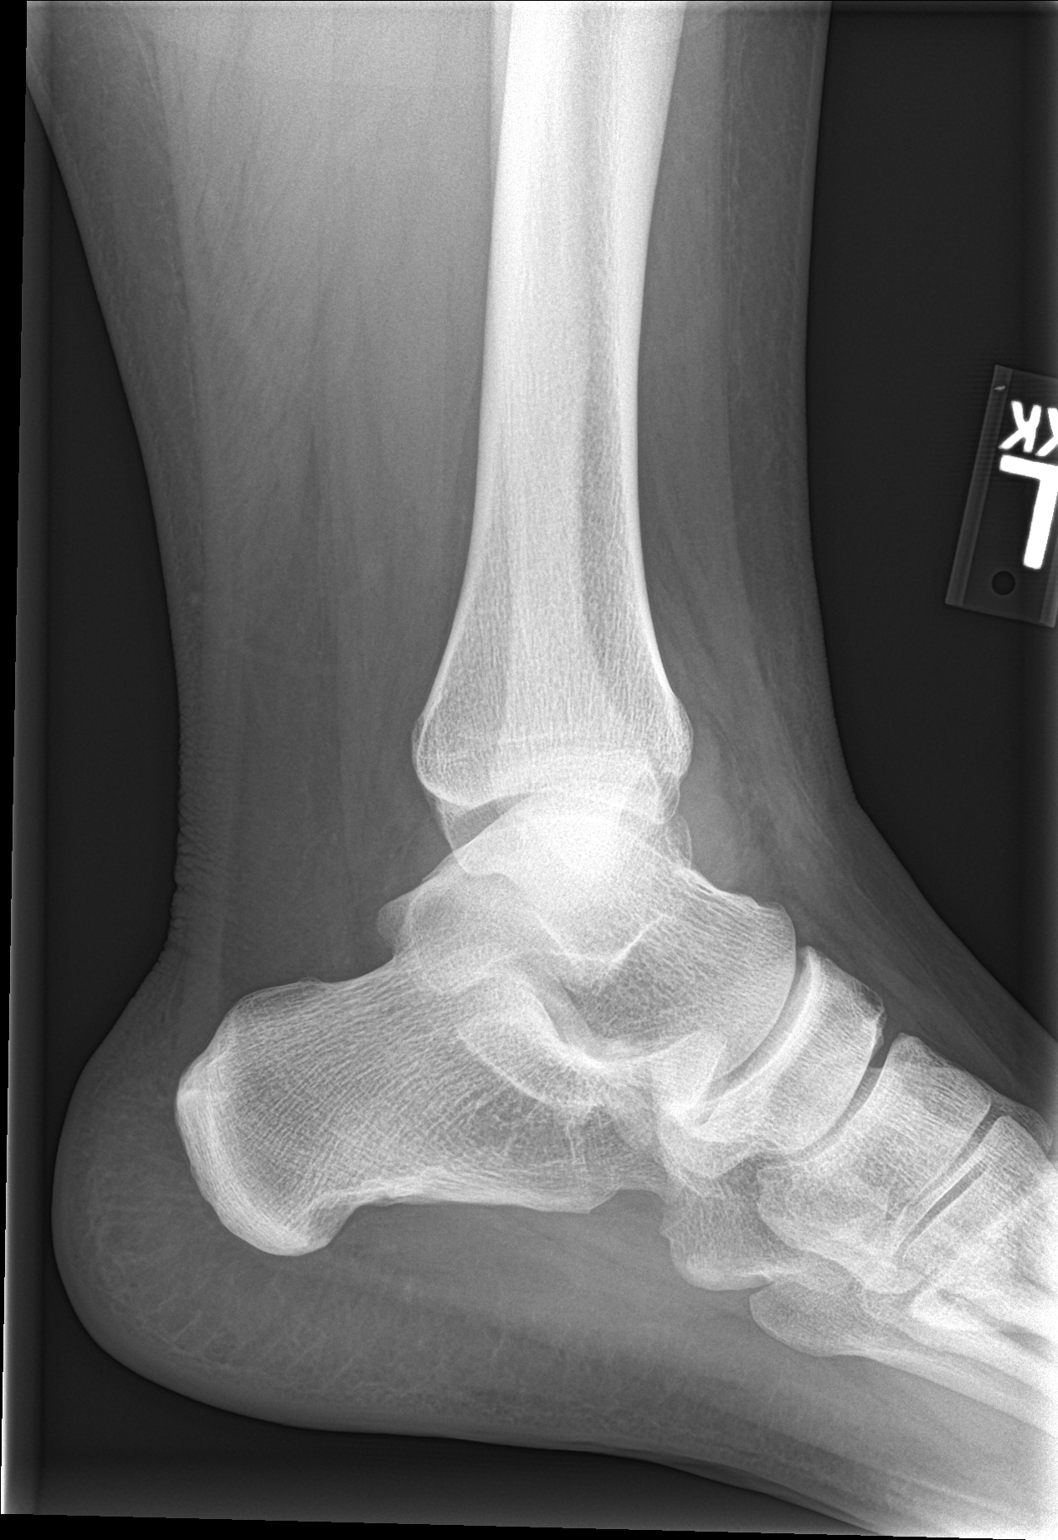

[3 of 3 positions shown; findings below may reference images not displayed]

FINDINGS: There is question subtle cortical discontinuity in the distal tip of
the fibula raising the question of fracture. Soft tissue swelling is
identified in the lateral left ankle. There is no dislocation.
IMPRESSION: There is question subtle cortical discontinuity in the distal tip of
the fibula raising the question of fracture. Soft tissue swelling is
identified in the lateral left ankle.

## 2020-05-20 ENCOUNTER — Other Ambulatory Visit: Payer: Self-pay

## 2020-05-20 ENCOUNTER — Emergency Department
Admission: EM | Admit: 2020-05-20 | Discharge: 2020-05-20 | Disposition: A | Discharge status: Home / Self Care | Payer: Medicaid Other | Attending: Emergency Medicine | Admitting: Emergency Medicine

## 2020-05-20 ENCOUNTER — Encounter: Payer: Self-pay | Admitting: Emergency Medicine

## 2020-05-20 DIAGNOSIS — F419 Anxiety disorder, unspecified: Secondary | ICD-10-CM | POA: Insufficient documentation

## 2020-05-20 DIAGNOSIS — G4700 Insomnia, unspecified: Secondary | ICD-10-CM | POA: Diagnosis not present

## 2020-05-20 MED ORDER — HYDROXYZINE HCL 10 MG PO TABS: 10.0000 mg | ORAL_TABLET | Freq: Three times a day (TID) | ORAL | 0 refills | Status: AC | PRN

## 2020-05-20 NOTE — ED Provider Notes (Signed)
[facility] Emergency Department Provider Note   ____________________________________________   Event Date/Time   First MD Initiated Contact with Patient 05/20/20 1210     (approximate)  I have reviewed the triage vital signs and the nursing notes.   HISTORY  Chief Complaint Anxiety    HPI Anthony Giles is a 25 y.o. male with no significant past medical history who presents to the ED complaining of anxiety.  Patient reports that he has been feeling increasingly anxious over the past couple of weeks due to stressors in his life.  He states that his father has been in the hospital for a couple of weeks now and that this has been causing him significant stress.  There is been associated with some difficulty sleeping and he has only been getting a couple hours of sleep per night.  He denies any depression, thoughts of suicide, or homicidal ideation.  He denies any medical complaints at this time.  He states he has PCP follow-up scheduled for later this week but would like some anxiety medication to last him until then.        Past Medical History:  Diagnosis Date  . Pneumonia     There are no problems to display for this patient.   Past Surgical History:  Procedure Laterality Date  . TONSILLECTOMY      Prior to Admission medications   Medication Sig Start Date End Date Taking? Authorizing Provider  hydrOXYzine (ATARAX/VISTARIL) 10 MG tablet Take 1 tablet (10 mg total) by mouth 3 (three) times daily as needed for anxiety. 05/20/20  Yes Chesley Noon, MD  Ascorbic Acid (VITAMIN C) 100 MG tablet Take 100 mg by mouth daily.    [provider]  cetirizine (ZYRTEC) 10 MG tablet Take by mouth.    [provider]  ketorolac (TORADOL) 10 MG tablet Take 1 tablet (10 mg total) by mouth every 6 (six) hours as needed for moderate pain or severe pain. 07/08/19   Tommie Sams, DO  fluticasone (FLONASE) 50 MCG/ACT nasal spray Place 2 sprays into  both nostrils daily. 12/08/17 07/08/19  Lutricia Feil, PA-C    Allergies Patient has no known allergies.  Family History  Problem Relation Age of Onset  . Healthy Mother   . CAD Father     Social History Social History   Tobacco Use  . Smoking status: Never Smoker  . Smokeless tobacco: Never Used  Vaping Use  . Vaping Use: Never used  Substance Use Topics  . Alcohol use: Never  . Drug use: Never    Review of Systems  Constitutional: No fever/chills Eyes: No visual changes. ENT: No sore throat. Cardiovascular: Denies chest pain. Respiratory: Denies shortness of breath. Gastrointestinal: No abdominal pain.  No nausea, no vomiting.  No diarrhea.  No constipation. Genitourinary: Negative for dysuria. Musculoskeletal: Negative for back pain. Skin: Negative for rash. Neurological: Negative for headaches, focal weakness or numbness.  Positive for anxiety.  ____________________________________________   PHYSICAL EXAM:  VITAL SIGNS: ED Triage Vitals  Enc Vitals Group     BP 05/20/20 1132 131/74     Pulse Rate 05/20/20 1132 75     Resp 05/20/20 1132 15     Temp 05/20/20 1132 98 F (36.7 C)     Temp Source 05/20/20 1132 Oral     SpO2 05/20/20 1132 98 %     Weight 05/20/20 1130 (!) 382 lb 0.9 oz (173.3 kg)     Height 05/20/20 1130 6'  3" (1.905 m)     Head Circumference --      Peak Flow --      Pain Score 05/20/20 1130 0     Pain Loc --      Pain Edu? --      Excl. in GC? --     Constitutional: Alert and oriented. Eyes: Conjunctivae are normal. Head: Atraumatic. Nose: No congestion/rhinnorhea. Mouth/Throat: Mucous membranes are moist. Neck: Normal ROM Cardiovascular: Normal rate, regular rhythm. Grossly normal heart sounds. Respiratory: Normal respiratory effort.  No retractions. Lungs CTAB. Gastrointestinal: Soft and nontender. No distention. Genitourinary: deferred Musculoskeletal: No lower extremity tenderness nor edema. Neurologic:  Normal speech  and language. No gross focal neurologic deficits are appreciated. Skin:  Skin is warm, dry and intact. No rash noted. Psychiatric: Mood and affect are normal. Speech and behavior are normal.  ____________________________________________   LABS (all labs ordered are listed, but only abnormal results are displayed)  Labs Reviewed - No data to display   PROCEDURES  Procedure(s) performed (including Critical Care):  Procedures   ____________________________________________   INITIAL IMPRESSION / ASSESSMENT AND PLAN / ED COURSE       25 year old male with no significant past medical history presents to the ED complaining of increasing anxiety over the past couple of weeks.  He denies any depression, suicidal or homicidal ideation.  He was offered psychiatry evaluation but declines.  He denies any medical complaints at this time.  He is appropriate for discharge home with PCP and outpatient psychiatry follow-up.  He was provided referral to [facility] as well as short course of Atarax.  He was counseled to return to the ED for any new or worsening symptoms, patient agrees with plan.      ____________________________________________   FINAL CLINICAL IMPRESSION(S) / ED DIAGNOSES  Final diagnoses:  Anxiety  Insomnia, unspecified type     ED Discharge Orders         Ordered    hydrOXYzine (ATARAX/VISTARIL) 10 MG tablet  3 times daily PRN        05/20/20 1230           Note:  This document was prepared using Dragon voice recognition software and may include unintentional dictation errors.   Chesley Noon, MD 05/20/20 1233

## 2020-05-20 NOTE — ED Triage Notes (Addendum)
Reports recent life stressors that are causing him to only sleep about 2 hours a night, most of last year and again since January.  Patient states "I have a lot of stress".  Patient denies any complaint at this time.  Per EMS report, patient was picked up from his car in the [employer] parking lot, sitting in the car with his soon to be ex-wife.  AAOx3.  Skin warm and dry . NAD  Denies SI/ HI
